# Patient Record
Sex: Female | Born: 1970
Health system: Southern US, Community
[De-identification: ages and names within clinical notes are randomized; demographics above are authoritative.]

## PROBLEM LIST (undated history)

## (undated) DIAGNOSIS — Z9289 Personal history of other medical treatment: Secondary | ICD-10-CM

## (undated) DIAGNOSIS — I10 Essential (primary) hypertension: Secondary | ICD-10-CM

## (undated) DIAGNOSIS — E119 Type 2 diabetes mellitus without complications: Secondary | ICD-10-CM

## (undated) DIAGNOSIS — R011 Cardiac murmur, unspecified: Secondary | ICD-10-CM

## (undated) DIAGNOSIS — E785 Hyperlipidemia, unspecified: Secondary | ICD-10-CM

## (undated) HISTORY — PX: MYOMECTOMY: SHX85

## (undated) HISTORY — DX: Cardiac murmur, unspecified: R01.1

---

## 2002-11-07 HISTORY — PX: FRACTURE SURGERY: SHX138

## 2008-11-07 HISTORY — PX: CYST REMOVAL HAND: SHX6279

## 2017-03-15 ENCOUNTER — Encounter: Payer: Self-pay | Admitting: Family Medicine

## 2017-03-16 ENCOUNTER — Encounter: Payer: Self-pay | Admitting: Family Medicine

## 2017-03-16 ENCOUNTER — Ambulatory Visit (INDEPENDENT_AMBULATORY_CARE_PROVIDER_SITE_OTHER): Payer: 59 | Admitting: Family Medicine

## 2017-03-16 VITALS — BP 131/83 | HR 79 | Temp 99.8°F | Resp 17 | Ht 61.5 in | Wt 218.0 lb

## 2017-03-16 DIAGNOSIS — R05 Cough: Secondary | ICD-10-CM

## 2017-03-16 DIAGNOSIS — J069 Acute upper respiratory infection, unspecified: Secondary | ICD-10-CM | POA: Diagnosis not present

## 2017-03-16 DIAGNOSIS — R059 Cough, unspecified: Secondary | ICD-10-CM

## 2017-03-16 MED ORDER — HYDROCODONE-HOMATROPINE 5-1.5 MG/5ML PO SYRP: 5.0000 mL | ORAL_SOLUTION | ORAL | 0 refills | Status: DC | PRN

## 2017-03-16 MED ORDER — BENZONATATE 100 MG PO CAPS: 100.0000 mg | ORAL_CAPSULE | Freq: Three times a day (TID) | ORAL | 0 refills | Status: DC | PRN

## 2017-03-16 NOTE — Patient Instructions (Addendum)
Drink plenty of fluids and get enough rest  Plan to stay off work through tomorrow. We will give you a work excuse.  If you get abruptly worse at anytime please get rechecked.  Take an over-the-counter histamine decongestant as needed for head congestion. Take acetaminophen (Tylenol) 500 mg 2 pills 3 times daily if needed for fever or body aches or/and ibuprofen 200 mg 3 tablets 3 times daily as needed for body aches or fevers.  Use the benzonatate cough pills one or 2 pills 3 times daily as needed for cough  Use the Hycodan cough syrup 1 teaspoon every 4-6 hours as needed for cough    IF you received an x-ray today, you will receive an invoice from [facility]. Please contact [facility] at [phone] with questions or concerns regarding your invoice.   IF you received labwork today, you will receive an invoice from [facility]. Please contact [facility] at [phone] with questions or concerns regarding your invoice.   Our billing staff will not be able to assist you with questions regarding bills from these companies.  You will be contacted with the lab results as soon as they are available. The fastest way to get your results is to activate your My Chart account. Instructions are located on the last page of this paperwork. If you have not heard from us regarding the results in 2 weeks, please contact this office.

## 2017-03-16 NOTE — Progress Notes (Signed)
Patient ID: Kristen AlbertsSherry Toelle, female    DOB: [birth year]  Age: 46 y.o. MRN: [id number]  Chief Complaint  Patient presents with  . Generalized Body Aches    onset last night  . Cough    yesterday  . Nasal Congestion    4 days / taking mucinex  . Fever    yesterday    Subjective:  46 year old lady who is here for the first time. [place] fall. She has been sick for several days, having had a sensitivity to cold air being blown on her. Monday she got worse with congestion and coughing. Yesterday and today she's been feverish and achy. No one else in her family is ill yet. She does not smoke. Is not on any regular medications. He is generally been pretty healthy.   Current allergies, medications, problem list, past/family and social histories reviewed.  Objective:  BP 131/83 (BP Location: Right Arm, Patient Position: Sitting, Cuff Size: Large)   Pulse 79   Temp 99.8 F (37.7 C) (Oral)   Resp 17   Ht 5' 1.5" (1.562 m)   Wt 218 lb (98.9 kg)   LMP 03/15/2017   SpO2 97%   BMI 40.52 kg/m   Alert and oriented. Looks like she doesn't feel real well but not extremely ill. Her TMs are normal. Eyes PERRLA. Throat clear. Nose not congested this time. Without significant nodes. Chest is clear to auscultation. Heart regular without any murmurs. A little tender in her left mid back.  Assessment & Plan:   Assessment: 1. Viral upper respiratory infection   2. Cough       Plan: Treat as a viral illness. Return as necessary.  No orders of the defined types were placed in this encounter.   Meds ordered this encounter  Medications  . HYDROcodone-homatropine (HYCODAN) 5-1.5 MG/5ML syrup    Sig: Take 5 mLs by mouth every 4 (four) hours as needed.    Dispense:  120 mL    Refill:  0  . benzonatate (TESSALON) 100 MG capsule    Sig: Take 1-2 capsules (100-200 mg total) by mouth 3 (three) times daily as needed.    Dispense:  30 capsule    Refill:  0          Patient Instructions   Drink plenty of fluids and get enough rest  Plan to stay off work through tomorrow. We will give you a work excuse.  If you get abruptly worse at anytime please get rechecked.  Take an over-the-counter histamine decongestant as needed for head congestion. Take acetaminophen (Tylenol) 500 mg 2 pills 3 times daily if needed for fever or body aches or/and ibuprofen 200 mg 3 tablets 3 times daily as needed for body aches or fevers.  Use the benzonatate cough pills one or 2 pills 3 times daily as needed for cough  Use the Hycodan cough syrup 1 teaspoon every 4-6 hours as needed for cough    IF you received an x-ray today, you will receive an invoice from [facility]. Please contact [facility] at [phone] with questions or concerns regarding your invoice.   IF you received labwork today, you will receive an invoice from [facility]. Please contact [facility] at [phone] with questions or concerns regarding your invoice.   Our billing staff will not be able to assist you with questions regarding bills from these companies.  You will be contacted with the lab results as soon as they are available. The fastest way  to get your results is to activate your My Chart account. Instructions are located on the last page of this paperwork. If you have not heard from [place] regarding the results in 2 weeks, please contact this office.        Return if symptoms worsen or fail to improve.   Bralon Antkowiak, MD 03/16/2017

## 2017-08-21 MED FILL — HYDROCHLOROTHIAZIDE 25 MG T: 25 | 30 days supply | Qty: 30 | Fill #0

## 2017-09-14 MED FILL — HYDROCHLOROTHIAZIDE 25 MG T: 25 | 30 days supply | Qty: 30 | Fill #1

## 2017-10-17 MED FILL — HYDROCHLOROTHIAZIDE 25 MG T: 25 | 90 days supply | Qty: 90 | Fill #0

## 2017-10-19 ENCOUNTER — Emergency Department (HOSPITAL_COMMUNITY): Payer: 59

## 2017-10-19 ENCOUNTER — Encounter (HOSPITAL_COMMUNITY): Payer: Self-pay

## 2017-10-19 ENCOUNTER — Observation Stay (HOSPITAL_COMMUNITY)
Admission: EM | Admit: 2017-10-19 | Discharge: 2017-10-20 | Disposition: A | Discharge status: Home / Self Care | Payer: 59 | Attending: Cardiology | Admitting: Cardiology

## 2017-10-19 DIAGNOSIS — E785 Hyperlipidemia, unspecified: Secondary | ICD-10-CM | POA: Diagnosis not present

## 2017-10-19 DIAGNOSIS — R079 Chest pain, unspecified: Principal | ICD-10-CM | POA: Diagnosis present

## 2017-10-19 DIAGNOSIS — E119 Type 2 diabetes mellitus without complications: Secondary | ICD-10-CM | POA: Insufficient documentation

## 2017-10-19 DIAGNOSIS — Z6841 Body Mass Index (BMI) 40.0 and over, adult: Secondary | ICD-10-CM | POA: Insufficient documentation

## 2017-10-19 DIAGNOSIS — E876 Hypokalemia: Secondary | ICD-10-CM | POA: Insufficient documentation

## 2017-10-19 DIAGNOSIS — R0602 Shortness of breath: Secondary | ICD-10-CM | POA: Diagnosis not present

## 2017-10-19 DIAGNOSIS — Z79899 Other long term (current) drug therapy: Secondary | ICD-10-CM | POA: Diagnosis not present

## 2017-10-19 DIAGNOSIS — E669 Obesity, unspecified: Secondary | ICD-10-CM | POA: Diagnosis not present

## 2017-10-19 DIAGNOSIS — R072 Precordial pain: Secondary | ICD-10-CM | POA: Diagnosis not present

## 2017-10-19 DIAGNOSIS — Z7984 Long term (current) use of oral hypoglycemic drugs: Secondary | ICD-10-CM | POA: Diagnosis not present

## 2017-10-19 DIAGNOSIS — I10 Essential (primary) hypertension: Secondary | ICD-10-CM | POA: Diagnosis not present

## 2017-10-19 HISTORY — DX: Type 2 diabetes mellitus without complications: E11.9

## 2017-10-19 HISTORY — DX: Hyperlipidemia, unspecified: E78.5

## 2017-10-19 HISTORY — DX: Personal history of other medical treatment: Z92.89

## 2017-10-19 HISTORY — DX: Essential (primary) hypertension: I10

## 2017-10-19 LAB — CBC
HCT: 36.7 % (ref 36.0–46.0)
HEMOGLOBIN: 12 g/dL (ref 12.0–15.0)
MCH: 28.8 pg (ref 26.0–34.0)
MCHC: 32.7 g/dL (ref 30.0–36.0)
MCV: 88.2 fL (ref 78.0–100.0)
Platelets: 366 10*3/uL (ref 150–400)
RBC: 4.16 MIL/uL (ref 3.87–5.11)
RDW: 14 % (ref 11.5–15.5)
WBC: 10.1 10*3/uL (ref 4.0–10.5)

## 2017-10-19 LAB — BASIC METABOLIC PANEL
ANION GAP: 10 (ref 5–15)
BUN: 10 mg/dL (ref 6–20)
CALCIUM: 9.3 mg/dL (ref 8.9–10.3)
CO2: 26 mmol/L (ref 22–32)
Chloride: 99 mmol/L — ABNORMAL LOW (ref 101–111)
Creatinine, Ser: 0.78 mg/dL (ref 0.44–1.00)
GLUCOSE: 130 mg/dL — AB (ref 65–99)
Potassium: 3.6 mmol/L (ref 3.5–5.1)
SODIUM: 135 mmol/L (ref 135–145)

## 2017-10-19 LAB — I-STAT BETA HCG BLOOD, ED (MC, WL, AP ONLY): I-stat hCG, quantitative: <5 m[IU]/mL (ref <5)

## 2017-10-19 LAB — I-STAT TROPONIN, ED
TROPONIN I, POC: 0 ng/mL (ref 0.00–0.08)
Troponin i, poc: 0 ng/mL (ref 0.00–0.08)

## 2017-10-19 LAB — D-DIMER, QUANTITATIVE (NOT AT ARMC): D DIMER QUANT: 0.61 ug{FEU}/mL — AB (ref 0.00–0.50)

## 2017-10-19 LAB — TROPONIN I

## 2017-10-19 IMAGING — DX DG CHEST 2V
2 series · 2 of 2 positions shown · non-contrast
Comparison: None.

CLINICAL DATA: Chest pain.

EXAM:
CHEST  2 VIEW

[chest pa]
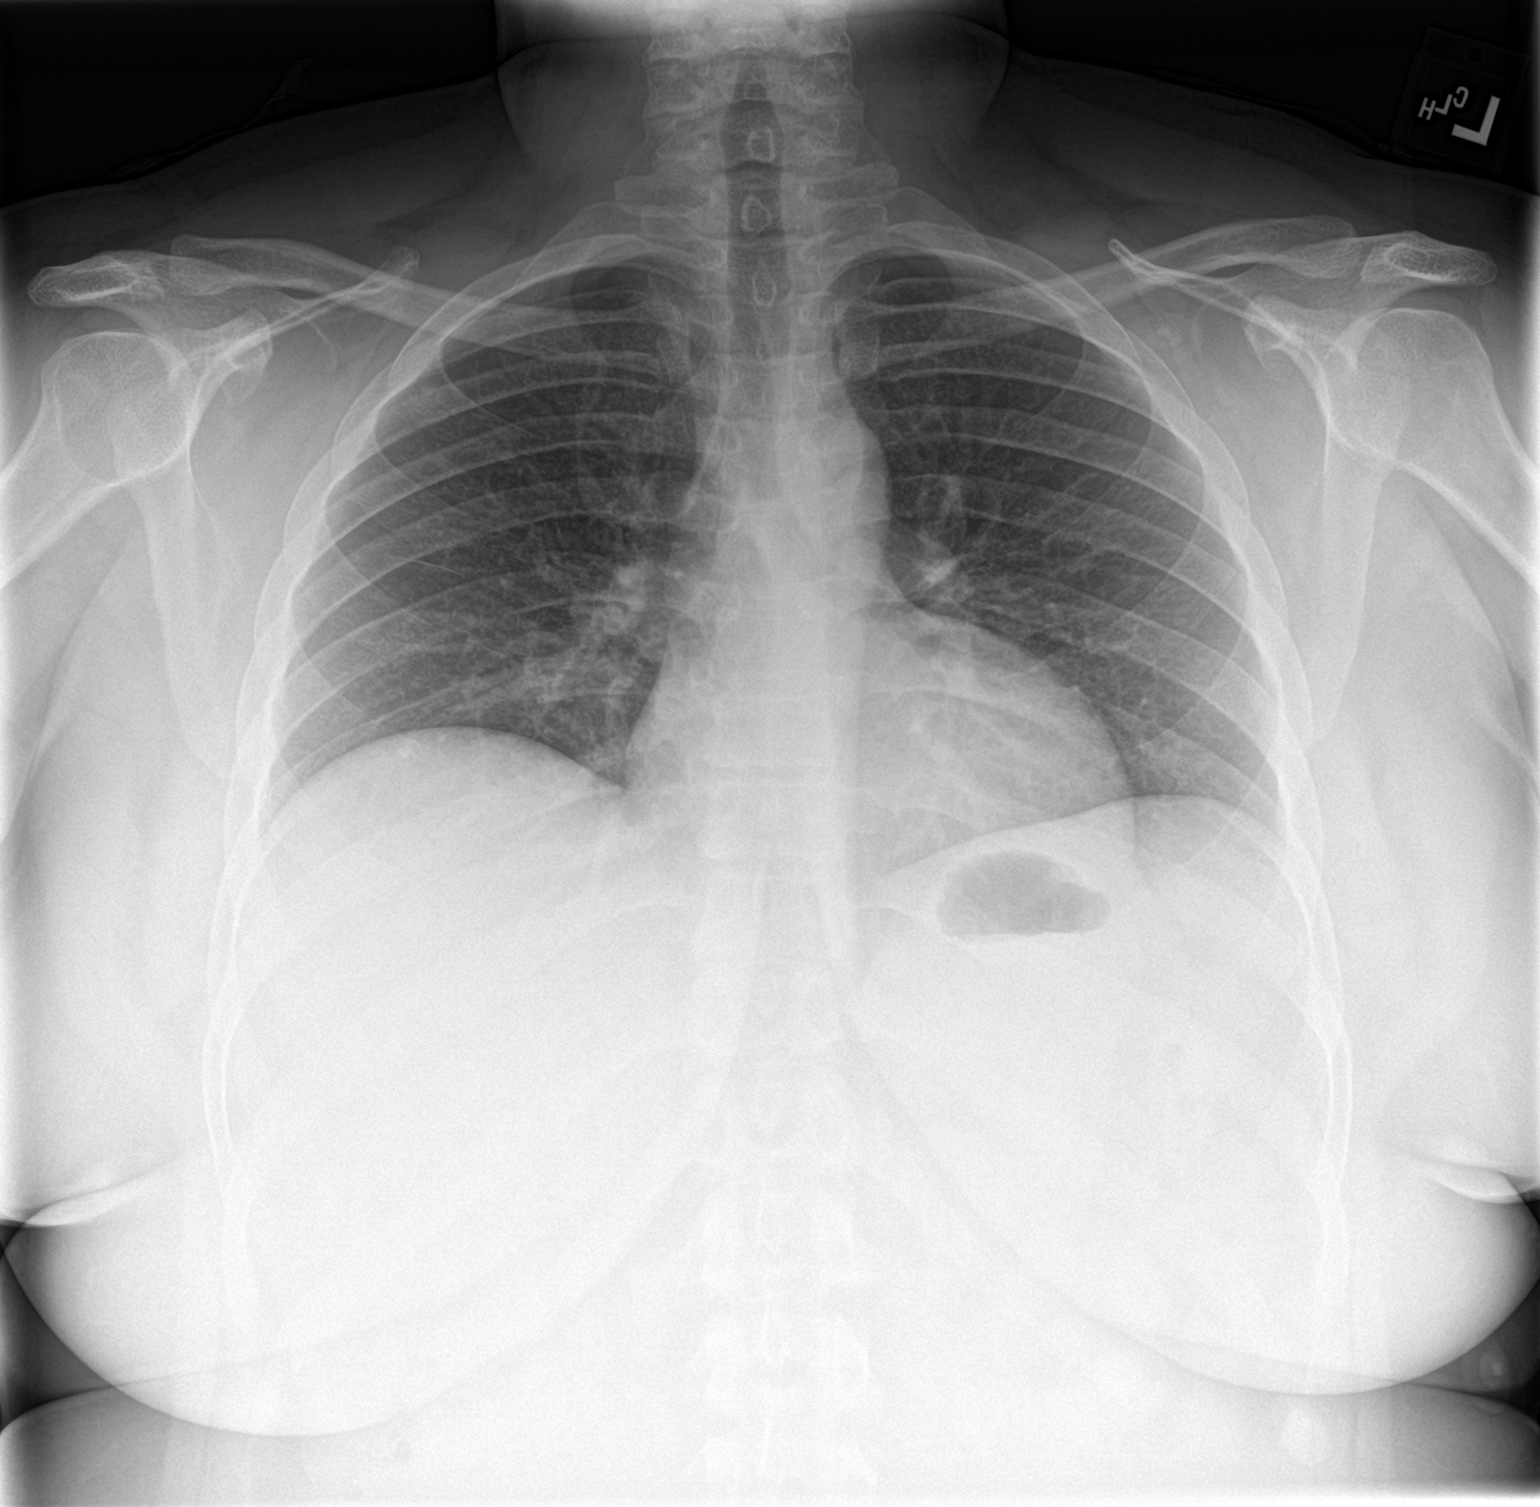

[chest lat]
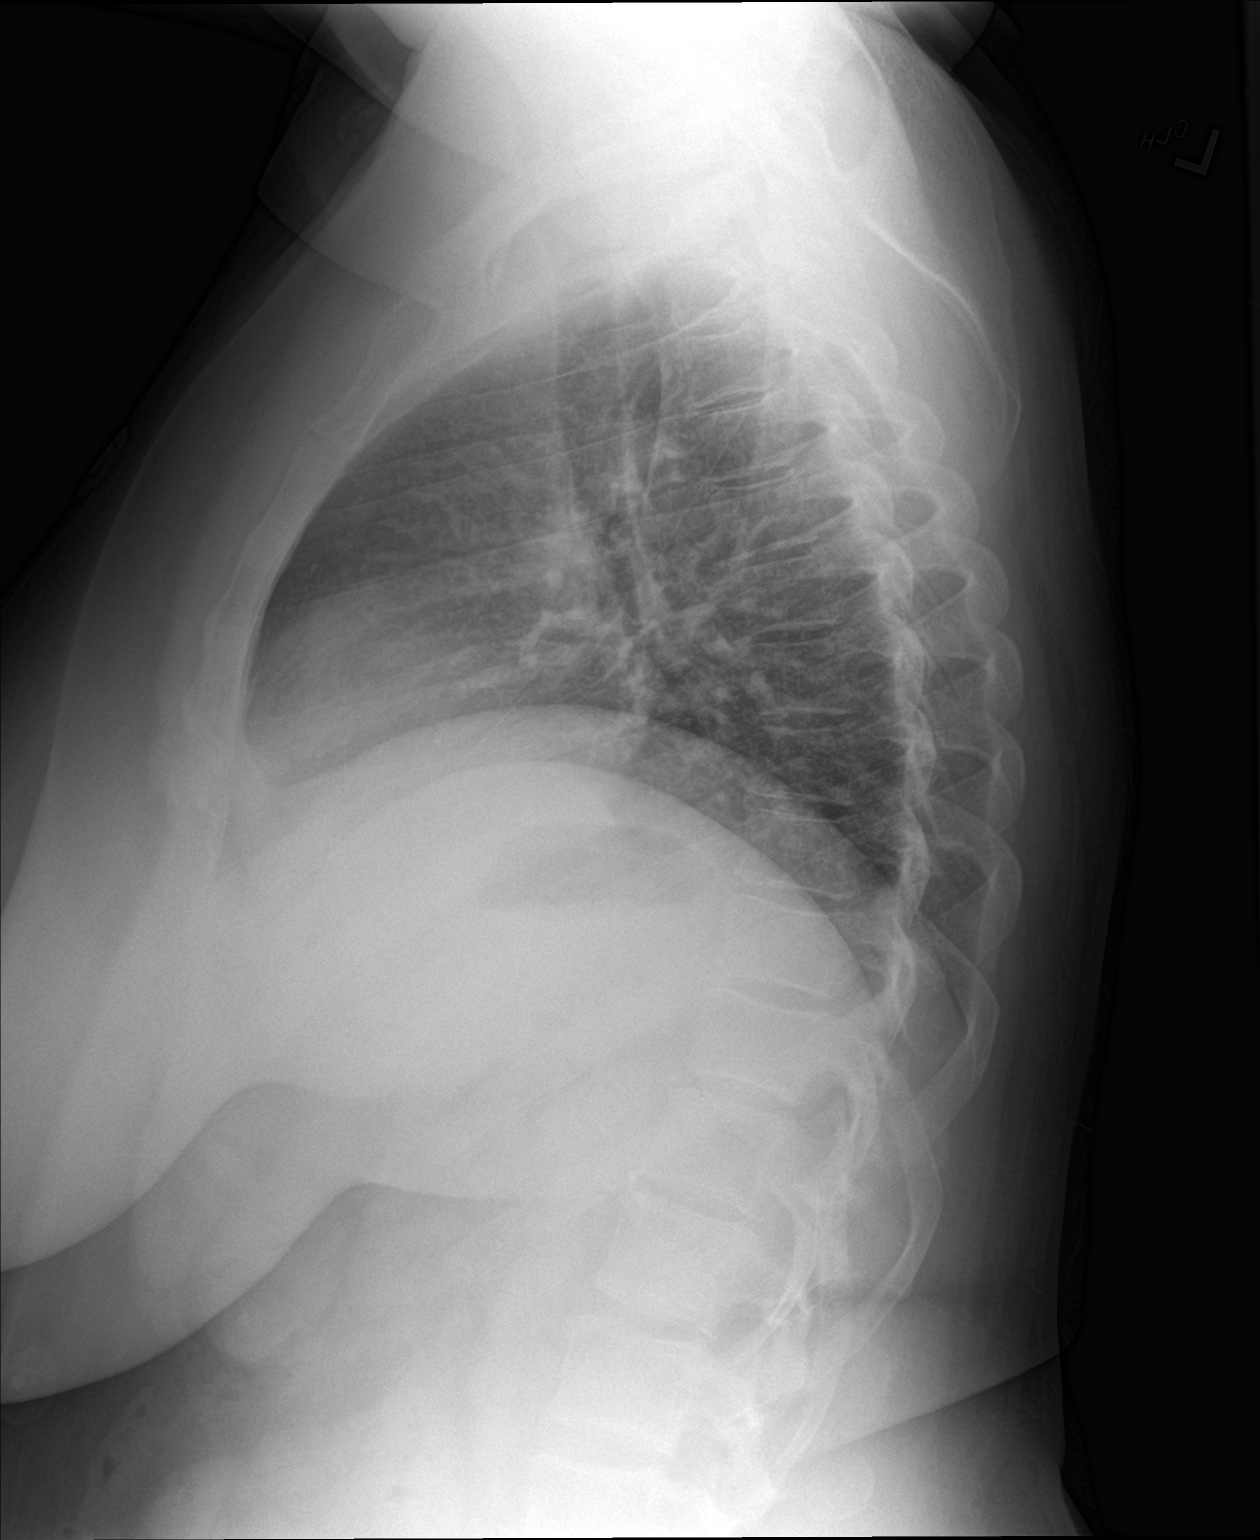

[2 of 2 positions shown; findings below may reference images not displayed]

FINDINGS: The heart size and mediastinal contours are within normal limits.
Both lungs are clear. No pneumothorax or pleural effusion is noted.
The visualized skeletal structures are unremarkable.
IMPRESSION: No active cardiopulmonary disease.

## 2017-10-19 MED ORDER — NITROGLYCERIN 0.4 MG SL SUBL: 0.4000 mg | SUBLINGUAL_TABLET | SUBLINGUAL | Status: DC | PRN

## 2017-10-19 MED ORDER — ACETAMINOPHEN 325 MG PO TABS
650.0000 mg | ORAL_TABLET | ORAL | Status: DC | PRN
  Administered 2017-10-20: 650 mg via ORAL
  Filled 2017-10-19: qty 2

## 2017-10-19 MED ORDER — ONDANSETRON HCL 4 MG/2ML IJ SOLN: 4.0000 mg | Freq: Four times a day (QID) | INTRAMUSCULAR | Status: DC | PRN

## 2017-10-19 MED ORDER — HEPARIN SODIUM (PORCINE) 5000 UNIT/ML IJ SOLN
5000.0000 [IU] | Freq: Three times a day (TID) | INTRAMUSCULAR | Status: DC
  Administered 2017-10-19 – 2017-10-20 (×3): 5000 [IU] via SUBCUTANEOUS
  Filled 2017-10-19 (×3): qty 1

## 2017-10-19 MED ORDER — HYDROCHLOROTHIAZIDE 25 MG PO TABS
25.0000 mg | ORAL_TABLET | Freq: Every day | ORAL | Status: DC
  Administered 2017-10-19 – 2017-10-20 (×2): 25 mg via ORAL
  Filled 2017-10-19 (×2): qty 1

## 2017-10-19 MED ORDER — NITROGLYCERIN 0.4 MG SL SUBL
0.4000 mg | SUBLINGUAL_TABLET | SUBLINGUAL | Status: DC | PRN
  Administered 2017-10-19: 0.4 mg via SUBLINGUAL
  Filled 2017-10-19: qty 1

## 2017-10-19 MED ORDER — ASPIRIN EC 81 MG PO TBEC
81.0000 mg | DELAYED_RELEASE_TABLET | Freq: Every day | ORAL | Status: DC
  Administered 2017-10-20: 81 mg via ORAL
  Filled 2017-10-19: qty 1

## 2017-10-19 MED ORDER — MORPHINE SULFATE (PF) 4 MG/ML IV SOLN
4.0000 mg | Freq: Once | INTRAVENOUS | Status: AC
  Administered 2017-10-19: 4 mg via INTRAVENOUS
  Filled 2017-10-19: qty 1

## 2017-10-19 MED ORDER — ASPIRIN 81 MG PO CHEW
324.0000 mg | CHEWABLE_TABLET | Freq: Once | ORAL | Status: AC
  Administered 2017-10-19: 324 mg via ORAL
  Filled 2017-10-19: qty 4

## 2017-10-19 MED ORDER — IOPAMIDOL (ISOVUE-370) INJECTION 76%
INTRAVENOUS | Status: AC
  Administered 2017-10-19: 80 mL
  Filled 2017-10-19: qty 100

## 2017-10-19 NOTE — H&P (Signed)
History & Physical    Patient ID: Kristen Livingston MRN: [id number], DOB/AGE: 46-Aug-1972   Admit date: 10/19/2017   Primary Physician: Patient, No Pcp Per Primary Cardiologist: New   Patient Profile    46 yo female with PMH of HTN, borderline HL, obesity and DM who presented with chest pain.   Past Medical History   Past Medical History:  Diagnosis Date  . Diabetes mellitus without complication (HCC)   . Heart murmur   . Hypertension     Past Surgical History:  Procedure Laterality Date  . CYST REMOVAL HAND Left 2010   breast  . FRACTURE SURGERY Right 2004   ankle/ plate placed   . MYOMECTOMY Bilateral      Allergies  No Known Allergies  History of Present Illness    Kristen Livingston is a 46 year old female with past medical history of hypertension, hyperlipidemia, obesity and diabetes.  Reports she was living in [place] up until about a year ago, when she moved to this area.  States she was followed by PCP back in [place], who noted that her LDL was elevated about a year and a half ago, but she elected for treatment with diet and exercise.  States she was established with a PCP here in the area, and recently became a vegan earlier this year and has not been on any medications for hyperlipidemia.  Started on metformin back in July, for elevated hemoglobin A1c.  She does not know any of her family's past medical history as she is adopted.  She currently works as a Paramedic, and her lifestyle is very sedentary.  Does report doing light household chores such as vacuuming and laundry and does not normally experience chest pain.  States on Tuesday in the afternoon she was outside shoveling snow and ice for about 45 minutes.  Shortly after that time she goes to [employer] and was walking around when she started to experience nausea, diaphoresis, chest tightness with radiation into her left arm.  States she also felt a little lightheaded and disoriented at that time as well.  She  walked back out to the car, and sat in her car for about 15 minutes her symptoms did eventually subside.  Yesterday reports having another episode while at rest in the afternoon, and was awoken from her sleep last night with chest pain again.  States she had another episode of chest pressure this morning, but did get ready and went to work.  Was talking with a coworker who is an Charity fundraiser instructed her she needed to make an appointment to see someone for her symptoms.  Called her doctor's office this morning with her symptoms, and was instructed to come to the ED for further evaluation.  In the ED her labs showed stable electrolytes, troponin negative x2, hemoglobin 12, d-dimer was elevated at 0.61.  CTA was negative for PE.  EKG showed sinus rhythm with nonspecific diffuse T wave abnormalities.  She is currently chest pain-free at the time of assessment.  Home Medications    Prior to Admission medications   Medication Sig Start Date End Date Taking? Authorizing Provider  hydrochlorothiazide (HYDRODIURIL) 25 MG tablet Take 25 mg by mouth daily. 10/17/17  Yes [provider]  metFORMIN (GLUCOPHAGE-XR) 500 MG 24 hr tablet Take 500 mg by mouth 2 (two) times daily. 07/31/17  Yes [provider]  benzonatate (TESSALON) 100 MG capsule Take 1-2 capsules (100-200 mg total) by mouth 3 (three) times daily as needed. Patient not taking:  Reported on 10/19/2017 03/16/17   Peyton NajjarHopper, David H, MD  HYDROcodone-homatropine [facility](HYCODAN) 5-1.5 MG/5ML syrup Take 5 mLs by mouth every 4 (four) hours as needed. Patient not taking: Reported on 10/19/2017 03/16/17   Peyton NajjarHopper, David H, MD    Family History     Family History  Adopted: Yes    Social History    Social History   Socioeconomic History  . Marital status: Single    Spouse name: Not on file  . Number of children: Not on file  . Years of education: Not on file  . Highest education level: Not on file  Social Needs  . Financial resource strain: Not  on file  . Food insecurity - worry: Not on file  . Food insecurity - inability: Not on file  . Transportation needs - medical: Not on file  . Transportation needs - non-medical: Not on file  Occupational History  . Not on file  Tobacco Use  . Smoking status: Never Smoker  . Smokeless tobacco: Never Used  Substance and Sexual Activity  . Alcohol use: Yes    Comment: once a month  . Drug use: No  . Sexual activity: Not on file  Other Topics Concern  . Not on file  Social History Narrative  . Not on file     Review of Systems    See HPI  All other systems reviewed and are otherwise negative except as noted above.  Physical Exam    Blood pressure (!) 108/55, pulse 77, temperature 99.4 F (37.4 C), temperature source Oral, resp. rate 18, last menstrual period 09/26/2017, SpO2 98 %.  General: Pleasant, obese younger AAF NAD Psych: Normal affect. Neuro: Alert and oriented X 3. Moves all extremities spontaneously. HEENT: Normal  Neck: Supple without bruits or JVD. Lungs:  Resp regular and unlabored, CTA. Heart: RRR no s3, s4, or murmurs. Abdomen: Soft, non-tender, non-distended, BS + x 4.  Extremities: No clubbing, cyanosis or edema. DP/PT/Radials 2+ and equal bilaterally.  Labs    Troponin [facility](Point of Care Test) Recent Labs    10/19/17 1327  TROPIPOC 0.00   No results for input(s): CKTOTAL, CKMB, TROPONINI in the last 72 hours. Lab Results  Component Value Date   WBC 10.1 10/19/2017   HGB 12.0 10/19/2017   HCT 36.7 10/19/2017   MCV 88.2 10/19/2017   PLT 366 10/19/2017    Recent Labs  Lab 10/19/17 1017  NA 135  K 3.6  CL 99*  CO2 26  BUN 10  CREATININE 0.78  CALCIUM 9.3  GLUCOSE 130*   No results found for: CHOL, HDL, LDLCALC, TRIG Lab Results  Component Value Date   DDIMER 0.61 (H) 10/19/2017     Radiology Studies    Dg Chest 2 View  Result Date: 10/19/2017 CLINICAL DATA:  Chest pain. EXAM: CHEST  2 VIEW COMPARISON:  None. FINDINGS: The heart  size and mediastinal contours are within normal limits. Both lungs are clear. No pneumothorax or pleural effusion is noted. The visualized skeletal structures are unremarkable. IMPRESSION: No active cardiopulmonary disease. Electronically Signed   By: Lupita RaiderJames  Green Jr, M.D.   On: 10/19/2017 10:37   Ct Angio Chest Pe W And/or Wo Contrast  Result Date: 10/19/2017 CLINICAL DATA:  Chest pain, shortness of breath. EXAM: CT ANGIOGRAPHY CHEST WITH CONTRAST TECHNIQUE: Multidetector CT imaging of the chest was performed using the standard protocol during bolus administration of intravenous contrast. Multiplanar CT image reconstructions and MIPs were obtained to evaluate the vascular anatomy.  CONTRAST:  80mL ISOVUE-370 IOPAMIDOL (ISOVUE-370) INJECTION 76% COMPARISON:  Radiographs same day. FINDINGS: Cardiovascular: Satisfactory opacification of the pulmonary arteries to the segmental level. No evidence of pulmonary embolism. Normal heart size. No pericardial effusion. Mediastinum/Nodes: No enlarged mediastinal, hilar, or axillary lymph nodes. Thyroid gland, trachea, and esophagus demonstrate no significant findings. Lungs/Pleura: Lungs are clear. No pleural effusion or pneumothorax. Upper Abdomen: Probable fatty infiltration of the liver is noted. No other abnormality seen in the visualized portion of upper abdomen. Musculoskeletal: No chest wall abnormality. No acute or significant osseous findings. Review of the MIP images confirms the above findings. IMPRESSION: No definite evidence of pulmonary embolus. Probable fatty infiltration of the liver. Electronically Signed   By: Lupita RaiderJames  Green Jr, M.D.   On: 10/19/2017 14:36    ECG & Cardiac Imaging    EKG: SR with nonspecific TW abnormalities  Assessment & Plan    46 yo female with PMH of HTN, borderline HL, obesity and DM who presented with chest pain.   1. Chest pain: Reports she was shoveling/breaking ice on Tuesday for about 45 minutes then developed chest  pain, nausea, diaphoresis and light-headedness shortly after while she was walking around a store. She has several more episodes over the next 48 hours. Episode last night that woke her from sleep. In the ED her troponin has been negative x2. EKG shows SR with nonspecific TW abnormalities. Story is concerning for ACS, and she does have CRFs including HTN, DM, and diet controlled HL. Likely plan for stress testing in the am, unless positive enzymes, then would plan for cath.    2. HTN: stable  3. DM: hold metformin -- SSI while inpatient -- check Hgb A1c   4. HL: reports having an elevated LDL about 2 years ago, but improved with diet.  -- check lipids  Signed, Laverda PageLindsay Roberts, NP-C Pager [phone] 10/19/2017, 3:29 PM     History and all data above reviewed.  Patient examined.  I agree with the findings as above.  She reports chest pain.  This has been off and on x 2 days.  Lasts for 25 minutes at a time.  7/10.  Radiates down to her left arm and is on the left side of her chest.  She has not had this before.  She does have cardiovascular risk factors.  She has diabetes that is marginally controlled.    The patient exam reveals COR:RRR  ,  Lungs: Clear  ,  Abd: Positive bowel sounds, no rebound no guarding, Ext No edema  .  All available labs, radiology testing, previous records reviewed. Agree with documented assessment and plan.  CHEST PAIN:  Typical and atypical features.  No objective evidence of ischemia.  Plan exercise myoview in the AM.  DM:  Check A1C.    Fayrene FearingJames King Pinzon  4:44 PM  10/19/2017
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
## 2017-10-19 NOTE — ED Triage Notes (Signed)
Patient complains of 2 days of left sided chest squeezing with left arm pain. States that she feels a little neurological off. Alert and oriented. Reports all symptoms started after shoveling snow on Tuesday

## 2017-10-20 ENCOUNTER — Other Ambulatory Visit: Payer: Self-pay

## 2017-10-20 ENCOUNTER — Observation Stay (HOSPITAL_BASED_OUTPATIENT_CLINIC_OR_DEPARTMENT_OTHER): Payer: 59

## 2017-10-20 ENCOUNTER — Encounter (HOSPITAL_COMMUNITY): Payer: Self-pay | Admitting: Physician Assistant

## 2017-10-20 DIAGNOSIS — E785 Hyperlipidemia, unspecified: Secondary | ICD-10-CM | POA: Diagnosis not present

## 2017-10-20 DIAGNOSIS — R079 Chest pain, unspecified: Secondary | ICD-10-CM | POA: Diagnosis not present

## 2017-10-20 DIAGNOSIS — Z6841 Body Mass Index (BMI) 40.0 and over, adult: Secondary | ICD-10-CM | POA: Diagnosis not present

## 2017-10-20 DIAGNOSIS — Z79899 Other long term (current) drug therapy: Secondary | ICD-10-CM | POA: Diagnosis not present

## 2017-10-20 DIAGNOSIS — I10 Essential (primary) hypertension: Secondary | ICD-10-CM | POA: Diagnosis present

## 2017-10-20 DIAGNOSIS — E119 Type 2 diabetes mellitus without complications: Secondary | ICD-10-CM | POA: Diagnosis not present

## 2017-10-20 DIAGNOSIS — E876 Hypokalemia: Secondary | ICD-10-CM | POA: Diagnosis not present

## 2017-10-20 DIAGNOSIS — Z7984 Long term (current) use of oral hypoglycemic drugs: Secondary | ICD-10-CM | POA: Diagnosis not present

## 2017-10-20 DIAGNOSIS — E669 Obesity, unspecified: Secondary | ICD-10-CM | POA: Diagnosis not present

## 2017-10-20 LAB — NM MYOCAR MULTI W/SPECT W/WALL MOTION / EF
CHL CUP RESTING HR STRESS: 89 {beats}/min
CSEPED: 5 min
CSEPEW: 7 METS
CSEPHR: 95 %
Exercise duration (sec): 37 s
MPHR: 174 {beats}/min
Peak HR: 166 {beats}/min

## 2017-10-20 LAB — BASIC METABOLIC PANEL
ANION GAP: 10 (ref 5–15)
BUN: 10 mg/dL (ref 6–20)
CHLORIDE: 99 mmol/L — AB (ref 101–111)
CO2: 24 mmol/L (ref 22–32)
Calcium: 8.5 mg/dL — ABNORMAL LOW (ref 8.9–10.3)
Creatinine, Ser: 0.72 mg/dL (ref 0.44–1.00)
GFR calc non Af Amer: >60 mL/min (ref >60)
Glucose, Bld: 143 mg/dL — ABNORMAL HIGH (ref 65–99)
POTASSIUM: 3.3 mmol/L — AB (ref 3.5–5.1)
SODIUM: 133 mmol/L — AB (ref 135–145)

## 2017-10-20 LAB — LIPID PANEL
CHOL/HDL RATIO: 3.6 ratio
CHOLESTEROL: 149 mg/dL (ref 0–200)
HDL: 41 mg/dL (ref >40)
LDL Cholesterol: 81 mg/dL (ref 0–99)
TRIGLYCERIDES: 137 mg/dL (ref <150)
VLDL: 27 mg/dL (ref 0–40)

## 2017-10-20 LAB — GLUCOSE, CAPILLARY
GLUCOSE-CAPILLARY: 109 mg/dL — AB (ref 65–99)
Glucose-Capillary: 135 mg/dL — ABNORMAL HIGH (ref 65–99)
Glucose-Capillary: 154 mg/dL — ABNORMAL HIGH (ref 65–99)

## 2017-10-20 LAB — HIV ANTIBODY (ROUTINE TESTING W REFLEX): HIV Screen 4th Generation wRfx: NONREACTIVE

## 2017-10-20 LAB — TROPONIN I
Troponin I: <0.03 ng/mL (ref <0.03)
Troponin I: <0.03 ng/mL (ref <0.03)

## 2017-10-20 MED ORDER — TECHNETIUM TC 99M TETROFOSMIN IV KIT
30.0000 | PACK | Freq: Once | INTRAVENOUS | Status: AC | PRN
  Administered 2017-10-20: 30 via INTRAVENOUS

## 2017-10-20 MED ORDER — METFORMIN HCL ER 500 MG PO TB24
500.0000 mg | ORAL_TABLET | Freq: Two times a day (BID) | ORAL | Status: DC
  Filled 2017-10-20: qty 1

## 2017-10-20 MED ORDER — GLUCOSE 40 % PO GEL: 1.0000 | Freq: Once | ORAL | Status: DC

## 2017-10-20 MED ORDER — POTASSIUM CHLORIDE CRYS ER 20 MEQ PO TBCR: 20.0000 meq | EXTENDED_RELEASE_TABLET | Freq: Every day | ORAL | 0 refills | Status: DC

## 2017-10-20 MED ORDER — GLUCOSE 4 G PO CHEW
CHEWABLE_TABLET | ORAL | Status: AC
  Administered 2017-10-20: 4 g
  Filled 2017-10-20: qty 1

## 2017-10-20 MED ORDER — ASPIRIN 81 MG PO TBEC: 81.0000 mg | DELAYED_RELEASE_TABLET | Freq: Every day | ORAL | Status: AC

## 2017-10-20 MED ORDER — POTASSIUM CHLORIDE CRYS ER 20 MEQ PO TBCR: 20.0000 meq | EXTENDED_RELEASE_TABLET | Freq: Every day | ORAL | Status: DC

## 2017-10-20 MED ORDER — POTASSIUM CHLORIDE CRYS ER 20 MEQ PO TBCR
40.0000 meq | EXTENDED_RELEASE_TABLET | Freq: Once | ORAL | Status: AC
  Administered 2017-10-20: 40 meq via ORAL
  Filled 2017-10-20: qty 2

## 2017-10-20 MED ORDER — TECHNETIUM TC 99M TETROFOSMIN IV KIT
10.0000 | PACK | Freq: Once | INTRAVENOUS | Status: AC | PRN
  Administered 2017-10-20: 10 via INTRAVENOUS

## 2017-10-20 NOTE — Discharge Summary (Signed)
Discharge Summary    Patient ID: Kristen AlbertsSherry Luckenbaugh,  MRN: [id number], DOB/AGE: 1971/11/02 46 y.o.  Admit date: 10/19/2017 Discharge date: 10/20/2017  Primary Care Provider: Patient, No Pcp Per Primary Cardiologist: Dr. Antoine PocheHochrein  Discharge Diagnoses    Principal Problem:   Chest pain Active Problems:   Hypertension   Diabetes mellitus without complication (HCC)   Hyperlipidemia   Hypokalemia    Diagnostic Studies/Procedures    See nuc below _____________     History of Present Illness     Kristen Livingston is a 46 y.o. female with history of hypertension, hyperlipidemia, obesity and diabetes.  Reports she was living in [place] up until about a year ago, when she moved to this area. States she was followed by PCP back in [place], who noted that her LDL was elevated about a year and a half ago, but she elected for treatment with diet and exercise. She has subsequently established with primary care locally. Family history is unknown as she is adopted. She was admitted with intermittent chest tightness, nausea, and diaphoresis. Due to persistently re-occurring symptoms she came to the ED for evaluation. In the ED her labs showed stable electrolytes, troponin negative x2, hemoglobin 12, d-dimer was elevated at 0.61.  CTA was negative for PE.  KG showed sinus rhythm with nonspecific diffuse T wave abnormalities. Vitals were stable. She was admitted for further evaluation and started on baby aspirin.   Hospital Course    Troponins remained negative and she underwent exercise nuclear stress test today which was normal. She did have a hypertensive response to exercise but had not received HCTZ prior to the test. She had no angina with exertion. Her symptoms are felt noncardiac. Given her diabetes she continued on daily aspirin and asked to discuss further management with PCP. I reviewed test result with patient. She was also given 40meq KCl for K of 3.3 today. Her K was 3.6 on admission  likely due to HCTZ so she received rx for 20meq KCl and was asked to f/u PCP to monitor further. Dr. Antoine PocheHochrein has seen and examined the patient today and feels she is stable for discharge.  _____________  Discharge Vitals Blood pressure 139/75, pulse 97, temperature 98.7 F (37.1 C), temperature source Oral, resp. rate 17, height 5\' 1"  (1.549 m), weight 217 lb 2.5 oz (98.5 kg), last menstrual period 09/26/2017, SpO2 98 %.  Filed Weights   10/20/17 0432  Weight: 217 lb 2.5 oz (98.5 kg)    Labs & Radiologic Studies    CBC Recent Labs    10/19/17 1017  WBC 10.1  HGB 12.0  HCT 36.7  MCV 88.2  PLT 366   Basic Metabolic Panel Recent Labs    40/98/1112/13/18 1017 10/20/17 0301  NA 135 133*  K 3.6 3.3*  CL 99* 99*  CO2 26 24  GLUCOSE 130* 143*  BUN 10 10  CREATININE 0.78 0.72  CALCIUM 9.3 8.5*   Cardiac Enzymes Recent Labs    10/19/17 2125 10/20/17 0301 10/20/17 1602  TROPONINI <0.03 <0.03 <0.03   BNP Invalid input(s): POCBNP D-Dimer Recent Labs    10/19/17 1145  DDIMER 0.61*   Hemoglobin A1C Fasting Lipid Panel Recent Labs    10/20/17 0301  CHOL 149  HDL 41  LDLCALC 81  TRIG 137  CHOLHDL 3.6  _____________  Dg Chest 2 View  Result Date: 10/19/2017 CLINICAL DATA:  Chest pain. EXAM: CHEST  2 VIEW COMPARISON:  None. FINDINGS: The heart size and mediastinal contours are  within normal limits. Both lungs are clear. No pneumothorax or pleural effusion is noted. The visualized skeletal structures are unremarkable. IMPRESSION: No active cardiopulmonary disease. Electronically Signed   By: Lupita RaiderJames  Green Jr, M.D.   On: 10/19/2017 10:37   Ct Angio Chest Pe W And/or Wo Contrast  Result Date: 10/19/2017 CLINICAL DATA:  Chest pain, shortness of breath. EXAM: CT ANGIOGRAPHY CHEST WITH CONTRAST TECHNIQUE: Multidetector CT imaging of the chest was performed using the standard protocol during bolus administration of intravenous contrast. Multiplanar CT image reconstructions and  MIPs were obtained to evaluate the vascular anatomy. CONTRAST:  80mL ISOVUE-370 IOPAMIDOL (ISOVUE-370) INJECTION 76% COMPARISON:  Radiographs same day. FINDINGS: Cardiovascular: Satisfactory opacification of the pulmonary arteries to the segmental level. No evidence of pulmonary embolism. Normal heart size. No pericardial effusion. Mediastinum/Nodes: No enlarged mediastinal, hilar, or axillary lymph nodes. Thyroid gland, trachea, and esophagus demonstrate no significant findings. Lungs/Pleura: Lungs are clear. No pleural effusion or pneumothorax. Upper Abdomen: Probable fatty infiltration of the liver is noted. No other abnormality seen in the visualized portion of upper abdomen. Musculoskeletal: No chest wall abnormality. No acute or significant osseous findings. Review of the MIP images confirms the above findings. IMPRESSION: No definite evidence of pulmonary embolus. Probable fatty infiltration of the liver. Electronically Signed   By: Lupita RaiderJames  Green Jr, M.D.   On: 10/19/2017 14:36   Nm Myocar Multi W/spect W/wall Motion / Ef  Result Date: 10/20/2017  Pt walked on a Bruce protocol GXT for 5:37. Peak HR of 166 which is 95% predicted max HR . there were no ST or T wave changes to suggest ischemia.  Blood pressure demonstrated a hypertensive response to exercise.  The study is normal.  Nuclear stress EF: 63%. The left ventricular ejection fraction is normal (55-65%).  This is a low risk study.    Disposition   Pt is being discharged home today in good condition.  Follow-up Plans & Appointments    Follow-up Information    Primary Care Provider Follow up.   Why:  Please follow up with your primary care provider within 1-2 weeks of discharge. You will also need a repeat potassium level since your potassium level was borderline low this admission.       Rollene RotundaHochrein, James, MD Follow up.   Specialty:  Cardiology Why:  You were cared for cardiology during your admission. Please call if you have any  questions. Contact information: [address] [place] [address] [phone]          Discharge Instructions    Diet - low sodium heart healthy   Complete by:  As directed    Increase activity slowly   Complete by:  As directed       Discharge Medications   Allergies as of 10/20/2017   No Known Allergies     Medication List    STOP taking these medications   benzonatate 100 MG capsule Commonly known as:  TESSALON   HYDROcodone-homatropine 5-1.5 MG/5ML syrup Commonly known as:  HYCODAN     TAKE these medications   aspirin 81 MG EC tablet Take 1 tablet (81 mg total) by mouth daily. Start taking on:  10/21/2017   hydrochlorothiazide 25 MG tablet Commonly known as:  HYDRODIURIL Take 25 mg by mouth daily.   metFORMIN 500 MG 24 hr tablet Commonly known as:  GLUCOPHAGE-XR Take 500 mg by mouth 2 (two) times daily.   potassium chloride SA 20 MEQ tablet Commonly known as:  K-DUR,KLOR-CON Take  1 tablet (20 mEq total) by mouth daily. Start taking on:  10/21/2017        Allergies:  No Known Allergies    Outstanding Labs/Studies   Pt advised to see PCP for repeat potassium level as OP  Duration of Discharge Encounter   Greater than 30 minutes including physician time.  Signed, Laurann Montana PA-C 10/20/2017, 6:24 PM

## 2017-10-20 NOTE — Progress Notes (Signed)
Back from Stress Test no complaints of pain or any distress noted.  Marya LandryAdrian Ethin Drummond, RN

## 2017-10-20 NOTE — Progress Notes (Signed)
Educated pt and husband on D/C medications, follow-up appointments, when to call MD. Answered all questions. Pt has all belongings, paperwork. Potassium Rx sent to pharmacy. N/C of pain or any needs at this time.  Marya LandryAdrian Silas Sedam, RN

## 2017-10-20 NOTE — Progress Notes (Signed)
Progress Note  Patient Name: Kristen AlbertsSherry Livingston Date of Encounter: 10/20/2017  Primary Cardiologist: Rollene RotundaJames Chandni Gagan, MD   Subjective   Feeling well. No chest pain, sob or palpitations.   Inpatient Medications    Scheduled Meds: . aspirin EC  81 mg Oral Daily  . heparin  5,000 Units Subcutaneous Q8H  . hydrochlorothiazide  25 mg Oral Daily   Continuous Infusions:  PRN Meds: acetaminophen, nitroGLYCERIN, ondansetron (ZOFRAN) IV   Vital Signs    Vitals:   10/19/17 1830 10/19/17 2050 10/19/17 2310 10/20/17 0432  BP: (!) 113/59 (!) 116/55 (!) 114/54 (!) 102/41  Pulse: 84 75 81 77  Resp: 19 (!) 23 (!) 21 15  Temp:  98.6 F (37 C) 98.6 F (37 C) 98.6 F (37 C)  TempSrc:  Oral Oral Oral  SpO2: 97% 97% 100% 98%  Weight:    217 lb 2.5 oz (98.5 kg)  Height:  5\' 1"  (1.549 m)     No intake or output data in the 24 hours ending 10/20/17 1121 Filed Weights   10/20/17 0432  Weight: 217 lb 2.5 oz (98.5 kg)    Telemetry    NSR - Personally Reviewed  ECG    Sinus tachycardia at rate of 104 bpm - Personally Reviewed  Physical Exam   GEN: obese female in No acute distress.   Neck: No JVD Cardiac: RRR, no murmurs, rubs, or gallops.  Respiratory: Clear to auscultation bilaterally. GI: Soft, nontender, non-distended  MS: No edema; No deformity. Neuro:  Nonfocal  Psych: Normal affect   Labs    Chemistry Recent Labs  Lab 10/19/17 1017 10/20/17 0301  NA 135 133*  K 3.6 3.3*  CL 99* 99*  CO2 26 24  GLUCOSE 130* 143*  BUN 10 10  CREATININE 0.78 0.72  CALCIUM 9.3 8.5*  GFRNONAA >60 >60  GFRAA >60 >60  ANIONGAP 10 10     Hematology Recent Labs  Lab 10/19/17 1017  WBC 10.1  RBC 4.16  HGB 12.0  HCT 36.7  MCV 88.2  MCH 28.8  MCHC 32.7  RDW 14.0  PLT 366    Cardiac Enzymes Recent Labs  Lab 10/19/17 2125 10/20/17 0301  TROPONINI <0.03 <0.03    Recent Labs  Lab 10/19/17 1028 10/19/17 1327  TROPIPOC 0.00 0.00     BNPNo results for input(s):  BNP, PROBNP in the last 168 hours.   DDimer  Recent Labs  Lab 10/19/17 1145  DDIMER 0.61*     Radiology    Dg Chest 2 View  Result Date: 10/19/2017 CLINICAL DATA:  Chest pain. EXAM: CHEST  2 VIEW COMPARISON:  None. FINDINGS: The heart size and mediastinal contours are within normal limits. Both lungs are clear. No pneumothorax or pleural effusion is noted. The visualized skeletal structures are unremarkable. IMPRESSION: No active cardiopulmonary disease. Electronically Signed   By: Lupita RaiderJames  Green Jr, M.D.   On: 10/19/2017 10:37   Ct Angio Chest Pe W And/or Wo Contrast  Result Date: 10/19/2017 CLINICAL DATA:  Chest pain, shortness of breath. EXAM: CT ANGIOGRAPHY CHEST WITH CONTRAST TECHNIQUE: Multidetector CT imaging of the chest was performed using the standard protocol during bolus administration of intravenous contrast. Multiplanar CT image reconstructions and MIPs were obtained to evaluate the vascular anatomy. CONTRAST:  80mL ISOVUE-370 IOPAMIDOL (ISOVUE-370) INJECTION 76% COMPARISON:  Radiographs same day. FINDINGS: Cardiovascular: Satisfactory opacification of the pulmonary arteries to the segmental level. No evidence of pulmonary embolism. Normal heart size. No pericardial effusion. Mediastinum/Nodes: No enlarged  mediastinal, hilar, or axillary lymph nodes. Thyroid gland, trachea, and esophagus demonstrate no significant findings. Lungs/Pleura: Lungs are clear. No pleural effusion or pneumothorax. Upper Abdomen: Probable fatty infiltration of the liver is noted. No other abnormality seen in the visualized portion of upper abdomen. Musculoskeletal: No chest wall abnormality. No acute or significant osseous findings. Review of the MIP images confirms the above findings. IMPRESSION: No definite evidence of pulmonary embolus. Probable fatty infiltration of the liver. Electronically Signed   By: Lupita RaiderJames  Green Jr, M.D.   On: 10/19/2017 14:36    Cardiac Studies   Pending myoview today    Patient Profile       46 yo female with PMH of HTN, borderline HL, obesity and DM who presented with chest pain.   Reports she was shoveling/breaking ice on Tuesday for about 45 minutes then developed chest pain, nausea, diaphoresis and light-headedness shortly after while she was walking around a store. She has several more episodes over the next 48 hours. Episode night of 12/12 that woke her from sleep.   Assessment & Plan    1. Chest pain - Typical and atypical features. No PE on CTA. Enzyme remained negative. Exercise myoview today. No recurrent chest pain. Continue ASA.  -10/20/2017: Cholesterol 149; HDL 41; LDL Cholesterol 81; Triglycerides 137; VLDL 27   2. HTN - Stable on HCTZ  3. DM - Metformin on hold.   For questions or updates, please contact [facility] Please consult [email or web address] for contact info under Cardiology/STEMI.      SignedManson Passey, Bhavinkumar Bhagat, PA  10/20/2017, 11:21 AM    History and all data above reviewed.  Patient examined.  I agree with the findings as above.  The patient exam reveals COR:RRR ,  Lungs: Clear  ,  Abd: Positive bowel sounds, no rebound no guarding, Ext No edema  .  All available labs, radiology testing, previous records reviewed. Agree with documented assessment and plan.  CHEST PAIN:  Results of Lexiscan Myoview are pending.  Home if no ischemia.  She has had no further chest pain.  No objective evidence of ischemia.    Fayrene FearingJames Delano Scardino  12:08 PM  10/20/2017

## 2017-10-20 NOTE — Progress Notes (Signed)
   Vergia AlbertsSherry Lofaro presented for a nuclear stress test today.  No immediate complications.  Stress imaging is pending at this time.  Preliminary EKG findings may be listed in the chart, but the stress test result will not be finalized until perfusion imaging is complete.  Patient was noted to have hypertensive response to exercise but did not get her scheduled HCTZ today. Will ask nuc med to call for this.  Laurann Montanaayna N Blaise Palladino, PA-C 10/20/2017, 2:10 PM

## 2017-10-21 LAB — HEMOGLOBIN A1C
Hgb A1c MFr Bld: 6.9 % — ABNORMAL HIGH (ref 4.8–5.6)
MEAN PLASMA GLUCOSE: 151 mg/dL

## 2017-10-23 MED FILL — POTASSIUM CL ER 20 MEQ TABL: 20 | 30 days supply | Qty: 30 | Fill #0

## 2017-11-08 MED FILL — METFORMIN HCL ER 500 MG TAB: 500 | 90 days supply | Qty: 180 | Fill #0

## 2017-11-30 DIAGNOSIS — E119 Type 2 diabetes mellitus without complications: Secondary | ICD-10-CM | POA: Diagnosis not present

## 2017-11-30 DIAGNOSIS — E876 Hypokalemia: Secondary | ICD-10-CM | POA: Diagnosis not present

## 2017-11-30 DIAGNOSIS — R072 Precordial pain: Secondary | ICD-10-CM | POA: Diagnosis not present

## 2017-11-30 DIAGNOSIS — Z114 Encounter for screening for human immunodeficiency virus [HIV]: Secondary | ICD-10-CM | POA: Diagnosis not present

## 2017-11-30 DIAGNOSIS — I10 Essential (primary) hypertension: Secondary | ICD-10-CM | POA: Diagnosis not present

## 2017-11-30 DIAGNOSIS — R002 Palpitations: Secondary | ICD-10-CM | POA: Diagnosis not present

## 2017-11-30 DIAGNOSIS — Z6841 Body Mass Index (BMI) 40.0 and over, adult: Secondary | ICD-10-CM | POA: Diagnosis not present

## 2017-11-30 MED FILL — LISINOPRIL-HCTZ 10-12.5 MG: 10-12.5 | 30 days supply | Qty: 30 | Fill #0

## 2017-12-13 ENCOUNTER — Other Ambulatory Visit: Payer: Self-pay | Admitting: Physician Assistant

## 2017-12-14 NOTE — Telephone Encounter (Signed)
Rx has been sent to the pharmacy electronically. ° °

## 2017-12-15 MED FILL — POTASSIUM CL ER 20 MEQ TABL: 20 | 30 days supply | Qty: 15 | Fill #0

## 2017-12-25 MED FILL — LISINOPRIL-HCTZ 10-12.5 MG: 10-12.5 | 90 days supply | Qty: 90 | Fill #1

## 2018-02-12 MED FILL — METFORMIN HCL ER 500 MG TAB: 500 | 90 days supply | Qty: 180 | Fill #0

## 2018-03-26 MED FILL — LISINOPRIL-HCTZ 10-12.5 MG: 10-12.5 | 60 days supply | Qty: 60 | Fill #2
